# Patient Record
Sex: Male | Born: 1990 | Race: White | Hispanic: No | Marital: Single | Smoking: Current every day smoker
Health system: Southern US, Community
[De-identification: ages and names within clinical notes are randomized; demographics above are authoritative.]

## PROBLEM LIST (undated history)

## (undated) DIAGNOSIS — F419 Anxiety disorder, unspecified: Secondary | ICD-10-CM

## (undated) DIAGNOSIS — J189 Pneumonia, unspecified organism: Secondary | ICD-10-CM

## (undated) HISTORY — PX: EYE SURGERY: SHX253

## (undated) HISTORY — PX: OTHER SURGICAL HISTORY: SHX169

---

## 2015-08-07 DIAGNOSIS — J45909 Unspecified asthma, uncomplicated: Secondary | ICD-10-CM | POA: Insufficient documentation

## 2015-12-09 DIAGNOSIS — H269 Unspecified cataract: Secondary | ICD-10-CM | POA: Insufficient documentation

## 2017-11-02 DIAGNOSIS — H26003 Unspecified infantile and juvenile cataract, bilateral: Secondary | ICD-10-CM | POA: Insufficient documentation

## 2017-11-02 DIAGNOSIS — G43709 Chronic migraine without aura, not intractable, without status migrainosus: Secondary | ICD-10-CM | POA: Insufficient documentation

## 2019-06-01 ENCOUNTER — Other Ambulatory Visit: Payer: Self-pay | Admitting: Urology

## 2019-06-02 ENCOUNTER — Other Ambulatory Visit (HOSPITAL_COMMUNITY)
Admission: RE | Admit: 2019-06-02 | Discharge: 2019-06-02 | Disposition: A | Discharge status: Home / Self Care | Payer: 59 | Source: Ambulatory Visit | Attending: Urology | Admitting: Urology

## 2019-06-02 DIAGNOSIS — Z20822 Contact with and (suspected) exposure to covid-19: Secondary | ICD-10-CM | POA: Insufficient documentation

## 2019-06-02 DIAGNOSIS — Z01812 Encounter for preprocedural laboratory examination: Secondary | ICD-10-CM | POA: Diagnosis not present

## 2019-06-02 LAB — SARS CORONAVIRUS 2 (TAT 6-24 HRS): SARS Coronavirus 2: NEGATIVE

## 2019-06-04 ENCOUNTER — Encounter (HOSPITAL_COMMUNITY)
Admission: RE | Admit: 2019-06-04 | Discharge: 2019-06-04 | Disposition: A | Discharge status: Home / Self Care | Payer: 59 | Source: Ambulatory Visit | Attending: Urology | Admitting: Urology

## 2019-06-04 ENCOUNTER — Encounter (HOSPITAL_COMMUNITY): Payer: Self-pay

## 2019-06-04 ENCOUNTER — Other Ambulatory Visit: Payer: Self-pay

## 2019-06-04 HISTORY — DX: Pneumonia, unspecified organism: J18.9

## 2019-06-04 HISTORY — DX: Anxiety disorder, unspecified: F41.9

## 2019-06-04 NOTE — Progress Notes (Signed)
Called at 1050am with no answer.  Recalled at 1105am with no answer.

## 2019-06-06 ENCOUNTER — Ambulatory Visit (HOSPITAL_COMMUNITY): Payer: 59 | Admitting: Certified Registered Nurse Anesthetist

## 2019-06-06 ENCOUNTER — Encounter (HOSPITAL_COMMUNITY)
Admission: RE | Disposition: A | Discharge status: Home / Self Care | Payer: Self-pay | Source: Home / Self Care | Attending: Urology

## 2019-06-06 ENCOUNTER — Other Ambulatory Visit: Payer: Self-pay

## 2019-06-06 ENCOUNTER — Ambulatory Visit (HOSPITAL_COMMUNITY)
Admission: RE | Admit: 2019-06-06 | Discharge: 2019-06-06 | Disposition: A | Discharge status: Home / Self Care | Payer: 59 | Attending: Urology | Admitting: Urology

## 2019-06-06 ENCOUNTER — Encounter (HOSPITAL_COMMUNITY): Payer: Self-pay | Admitting: Urology

## 2019-06-06 DIAGNOSIS — N5089 Other specified disorders of the male genital organs: Secondary | ICD-10-CM

## 2019-06-06 DIAGNOSIS — C6292 Malignant neoplasm of left testis, unspecified whether descended or undescended: Secondary | ICD-10-CM | POA: Insufficient documentation

## 2019-06-06 DIAGNOSIS — N509 Disorder of male genital organs, unspecified: Secondary | ICD-10-CM | POA: Insufficient documentation

## 2019-06-06 DIAGNOSIS — Z885 Allergy status to narcotic agent status: Secondary | ICD-10-CM | POA: Insufficient documentation

## 2019-06-06 DIAGNOSIS — F17200 Nicotine dependence, unspecified, uncomplicated: Secondary | ICD-10-CM | POA: Diagnosis not present

## 2019-06-06 HISTORY — PX: ORCHIECTOMY: SHX2116

## 2019-06-06 LAB — CBC
HCT: 47.1 % (ref 39.0–52.0)
Hemoglobin: 17 g/dL (ref 13.0–17.0)
MCH: 33.8 pg (ref 26.0–34.0)
MCHC: 36.1 g/dL — ABNORMAL HIGH (ref 30.0–36.0)
MCV: 93.6 fL (ref 80.0–100.0)
Platelets: 209 10*3/uL (ref 150–400)
RBC: 5.03 MIL/uL (ref 4.22–5.81)
RDW: 11.8 % (ref 11.5–15.5)
WBC: 7.8 10*3/uL (ref 4.0–10.5)
nRBC: 0 % (ref 0.0–0.2)

## 2019-06-06 SURGERY — ORCHIECTOMY
Anesthesia: General | Laterality: Left

## 2019-06-06 MED ORDER — DEXAMETHASONE SODIUM PHOSPHATE 10 MG/ML IJ SOLN
INTRAMUSCULAR | Status: AC
  Filled 2019-06-06: qty 1

## 2019-06-06 MED ORDER — KETOROLAC TROMETHAMINE 15 MG/ML IJ SOLN: 15.0000 mg | Freq: Once | INTRAMUSCULAR | Status: DC

## 2019-06-06 MED ORDER — ONDANSETRON HCL 4 MG/2ML IJ SOLN
INTRAMUSCULAR | Status: DC | PRN
  Administered 2019-06-06: 4 mg via INTRAVENOUS

## 2019-06-06 MED ORDER — KETOROLAC TROMETHAMINE 30 MG/ML IJ SOLN
30.0000 mg | Freq: Once | INTRAMUSCULAR | Status: AC | PRN
  Administered 2019-06-06: 30 mg via INTRAVENOUS

## 2019-06-06 MED ORDER — DEXAMETHASONE SODIUM PHOSPHATE 4 MG/ML IJ SOLN
INTRAMUSCULAR | Status: DC | PRN
  Administered 2019-06-06: 10 mg via INTRAVENOUS

## 2019-06-06 MED ORDER — LACTATED RINGERS IV SOLN: INTRAVENOUS | Status: DC

## 2019-06-06 MED ORDER — ONDANSETRON HCL 4 MG/2ML IJ SOLN
INTRAMUSCULAR | Status: AC
  Filled 2019-06-06: qty 2

## 2019-06-06 MED ORDER — TRAMADOL HCL 50 MG PO TABS: 50.0000 mg | ORAL_TABLET | Freq: Four times a day (QID) | ORAL | Status: DC | PRN

## 2019-06-06 MED ORDER — MIDAZOLAM HCL 2 MG/2ML IJ SOLN
INTRAMUSCULAR | Status: DC | PRN
  Administered 2019-06-06: 2 mg via INTRAVENOUS

## 2019-06-06 MED ORDER — MIDAZOLAM HCL 2 MG/2ML IJ SOLN
INTRAMUSCULAR | Status: AC
  Filled 2019-06-06: qty 2

## 2019-06-06 MED ORDER — KETOROLAC TROMETHAMINE 30 MG/ML IJ SOLN
INTRAMUSCULAR | Status: AC
  Filled 2019-06-06: qty 1

## 2019-06-06 MED ORDER — BUPIVACAINE HCL (PF) 0.25 % IJ SOLN
INTRAMUSCULAR | Status: DC | PRN
  Administered 2019-06-06: 25 mL

## 2019-06-06 MED ORDER — FENTANYL CITRATE (PF) 100 MCG/2ML IJ SOLN
INTRAMUSCULAR | Status: AC
  Filled 2019-06-06: qty 2

## 2019-06-06 MED ORDER — GLYCOPYRROLATE PF 0.2 MG/ML IJ SOSY
PREFILLED_SYRINGE | INTRAMUSCULAR | Status: AC
  Filled 2019-06-06: qty 1

## 2019-06-06 MED ORDER — LIDOCAINE 2% (20 MG/ML) 5 ML SYRINGE
INTRAMUSCULAR | Status: AC
  Filled 2019-06-06: qty 5

## 2019-06-06 MED ORDER — LIDOCAINE 2% (20 MG/ML) 5 ML SYRINGE
INTRAMUSCULAR | Status: DC | PRN
  Administered 2019-06-06: 60 mg via INTRAVENOUS

## 2019-06-06 MED ORDER — TRAMADOL HCL 50 MG PO TABS: 50.0000 mg | ORAL_TABLET | Freq: Four times a day (QID) | ORAL | 0 refills | Status: AC | PRN

## 2019-06-06 MED ORDER — FENTANYL CITRATE (PF) 100 MCG/2ML IJ SOLN: 25.0000 ug | INTRAMUSCULAR | Status: DC | PRN

## 2019-06-06 MED ORDER — FENTANYL CITRATE (PF) 100 MCG/2ML IJ SOLN
INTRAMUSCULAR | Status: DC | PRN
  Administered 2019-06-06 (×5): 50 ug via INTRAVENOUS

## 2019-06-06 MED ORDER — ACETAMINOPHEN 500 MG PO TABS
1000.0000 mg | ORAL_TABLET | Freq: Once | ORAL | Status: AC
  Administered 2019-06-06: 1000 mg via ORAL
  Filled 2019-06-06: qty 2

## 2019-06-06 MED ORDER — CEFAZOLIN SODIUM-DEXTROSE 2-4 GM/100ML-% IV SOLN
2.0000 g | INTRAVENOUS | Status: AC
  Administered 2019-06-06: 2 g via INTRAVENOUS
  Filled 2019-06-06: qty 100

## 2019-06-06 MED ORDER — PROPOFOL 10 MG/ML IV BOLUS
INTRAVENOUS | Status: DC | PRN
  Administered 2019-06-06: 200 mg via INTRAVENOUS

## 2019-06-06 MED ORDER — FENTANYL CITRATE (PF) 250 MCG/5ML IJ SOLN
INTRAMUSCULAR | Status: AC
  Filled 2019-06-06: qty 5

## 2019-06-06 MED ORDER — 0.9 % SODIUM CHLORIDE (POUR BTL) OPTIME
TOPICAL | Status: DC | PRN
  Administered 2019-06-06: 1000 mL

## 2019-06-06 MED ORDER — BUPIVACAINE HCL 0.25 % IJ SOLN
INTRAMUSCULAR | Status: AC
  Filled 2019-06-06: qty 1

## 2019-06-06 MED ORDER — PROMETHAZINE HCL 25 MG/ML IJ SOLN: 6.2500 mg | INTRAMUSCULAR | Status: DC | PRN

## 2019-06-06 MED ORDER — PHENYLEPHRINE 40 MCG/ML (10ML) SYRINGE FOR IV PUSH (FOR BLOOD PRESSURE SUPPORT)
PREFILLED_SYRINGE | INTRAVENOUS | Status: AC
  Filled 2019-06-06: qty 10

## 2019-06-06 SURGICAL SUPPLY — 37 items
BENZOIN TINCTURE PRP APPL 2/3 (GAUZE/BANDAGES/DRESSINGS) IMPLANT
BNDG GAUZE ELAST 4 BULKY (GAUZE/BANDAGES/DRESSINGS) IMPLANT
CLOSURE WOUND 1/2 X4 (GAUZE/BANDAGES/DRESSINGS)
COVER SURGICAL LIGHT HANDLE (MISCELLANEOUS) ×3 IMPLANT
COVER WAND RF STERILE (DRAPES) IMPLANT
DERMABOND ADVANCED (GAUZE/BANDAGES/DRESSINGS) ×2
DERMABOND ADVANCED .7 DNX12 (GAUZE/BANDAGES/DRESSINGS) ×1 IMPLANT
DISSECTOR ROUND CHERRY 3/8 STR (MISCELLANEOUS) IMPLANT
DRAIN PENROSE 0.25X18 (DRAIN) ×3 IMPLANT
DRAPE LAPAROTOMY T 98X78 PEDS (DRAPES) ×3 IMPLANT
ELECT REM PT RETURN 15FT ADLT (MISCELLANEOUS) ×3 IMPLANT
GAUZE SPONGE 4X4 12PLY STRL (GAUZE/BANDAGES/DRESSINGS) ×3 IMPLANT
GLOVE BIOGEL M STRL SZ7.5 (GLOVE) ×3 IMPLANT
GOWN STRL REUS W/TWL XL LVL3 (GOWN DISPOSABLE) ×3 IMPLANT
KIT BASIN (CUSTOM PROCEDURE TRAY) ×3 IMPLANT
KIT TURNOVER KIT A (KITS) IMPLANT
NEEDLE HYPO 22GX1.5 SAFETY (NEEDLE) ×3 IMPLANT
NS IRRIG 1000ML POUR BTL (IV SOLUTION) ×3 IMPLANT
PACK GENERAL/GYN (CUSTOM PROCEDURE TRAY) ×3 IMPLANT
PENCIL SMOKE EVACUATOR (MISCELLANEOUS) IMPLANT
STRIP CLOSURE SKIN 1/2X4 (GAUZE/BANDAGES/DRESSINGS) IMPLANT
SUPPORT SCROTAL LG STRP (MISCELLANEOUS) IMPLANT
SUPPORT SCROTAL MED ADLT STRP (MISCELLANEOUS) ×2 IMPLANT
SUPPORTER ATHLETIC LG (MISCELLANEOUS)
SUPPORTER ATHLETIC MED (MISCELLANEOUS) ×1
SUT MNCRL AB 4-0 PS2 18 (SUTURE) ×3 IMPLANT
SUT SILK 0 (SUTURE)
SUT SILK 0 30XBRD TIE 6 (SUTURE) IMPLANT
SUT SILK 2 0 (SUTURE) ×2
SUT SILK 2 0 SH CR/8 (SUTURE) ×3 IMPLANT
SUT SILK 2-0 18XBRD TIE 12 (SUTURE) ×1 IMPLANT
SUT VIC AB 2-0 SH 27 (SUTURE)
SUT VIC AB 2-0 SH 27X BRD (SUTURE) IMPLANT
SUT VIC AB 3-0 SH 27 (SUTURE) ×4
SUT VIC AB 3-0 SH 27XBRD (SUTURE) ×2 IMPLANT
SYR CONTROL 10ML LL (SYRINGE) ×3 IMPLANT
TOWEL OR 17X26 10 PK STRL BLUE (TOWEL DISPOSABLE) ×3 IMPLANT

## 2019-06-06 NOTE — Interval H&P Note (Signed)
History and Physical Interval Note:  06/06/2019 9:50 AM  Jeffery Barker  has presented today for surgery, with the diagnosis of left testicular mass.  The various methods of treatment have been discussed with the patient and family. After consideration of risks, benefits and other options for treatment, the patient has consented to  Procedure(s): LEFT RADICAL ORCHIECTOMY (Left) as a surgical intervention.  The patient's history has been reviewed, patient examined, no change in status, stable for surgery.  I have reviewed the patient's chart and labs.  Questions were answered to the patient's satisfaction.     Ardis Hughs

## 2019-06-06 NOTE — Transfer of Care (Signed)
Immediate Anesthesia Transfer of Care Note  Patient: Jeffery Barker  Procedure(s) Performed: LEFT RADICAL ORCHIECTOMY (Left )  Patient Location: PACU  Anesthesia Type:General  Level of Consciousness: awake and patient cooperative  Airway & Oxygen Therapy: Patient Spontanous Breathing and Patient connected to face mask  Post-op Assessment: Report given to RN and Post -op Vital signs reviewed and stable  Post vital signs: Reviewed and stable  Last Vitals:  Vitals Value Taken Time  BP 153/99 06/06/19 1143  Temp    Pulse 80 06/06/19 1144  Resp 20 06/06/19 1144  SpO2 100 % 06/06/19 1144  Vitals shown include unvalidated device data.  Last Pain:  Vitals:   06/06/19 0927  TempSrc:   PainSc: 0-No pain         Complications: No apparent anesthesia complications

## 2019-06-06 NOTE — Anesthesia Preprocedure Evaluation (Signed)
Anesthesia Evaluation  Patient identified by MRN, date of birth, ID band Patient awake    Reviewed: Allergy & Precautions, NPO status , Patient's Chart, lab work & pertinent test results  Airway Mallampati: II  TM Distance: >3 FB Neck ROM: Full    Dental  (+) Dental Advisory Given   Pulmonary Current Smoker,    breath sounds clear to auscultation       Cardiovascular negative cardio ROS   Rhythm:Regular Rate:Normal     Neuro/Psych negative neurological ROS     GI/Hepatic negative GI ROS, Neg liver ROS,   Endo/Other  negative endocrine ROS  Renal/GU negative Renal ROS     Musculoskeletal   Abdominal   Peds  Hematology negative hematology ROS (+)   Anesthesia Other Findings   Reproductive/Obstetrics                             Anesthesia Physical Anesthesia Plan  ASA: II  Anesthesia Plan: General   Post-op Pain Management:    Induction: Intravenous  PONV Risk Score and Plan: 1 and Dexamethasone, Ondansetron and Treatment may vary due to age or medical condition  Airway Management Planned: LMA  Additional Equipment: None  Intra-op Plan:   Post-operative Plan: Extubation in OR  Informed Consent: I have reviewed the patients History and Physical, chart, labs and discussed the procedure including the risks, benefits and alternatives for the proposed anesthesia with the patient or authorized representative who has indicated his/her understanding and acceptance.     Dental advisory given  Plan Discussed with: CRNA  Anesthesia Plan Comments:         Anesthesia Quick Evaluation

## 2019-06-06 NOTE — Discharge Instructions (Signed)
Orchiectomy: POST OP INSTRUCTIONS  1. DIET: Follow a light bland diet the first 24 hours after arrival home, such as soup, liquids, crackers, etc.  Be sure to include lots of fluids daily.  Avoid fast food or heavy meals as your are more likely to get nauseated.  Eat a low fat the next few days after surgery. 2. Take your usually prescribed home medications unless otherwise directed. 3. PAIN CONTROL: a. Pain is best controlled by a usual combination of three different methods TOGETHER: i. Ice/Heat ii. Over the counter pain medication iii. Prescription pain medication b. Most patients will experience some swelling and bruising around the incision.  Ice packs or heating pads (30-60 minutes up to 6 times a day) will help. Use ice for the first few days to help decrease swelling and bruising, then switch to heat to help relax tight/sore spots and speed recovery.  Some people prefer to use ice alone, heat alone, alternating between ice & heat.  Experiment to what works for you.  Swelling and bruising can take several weeks to resolve.   c. It is helpful to take an over-the-counter pain medication regularly for the first few weeks.  Choose one of the following that works best for you: i. Naproxen (Aleve, etc)  Two 220mg  tabs twice a day ii. Ibuprofen (Advil, etc) Three 200mg  tabs four times a day (every meal & bedtime) iii. Acetaminophen (Tylenol, etc) 325-650mg  four times a day (every meal & bedtime) d. A  prescription for pain medication should be given to you upon discharge.  Take your pain medication as prescribed.  i. If you are having problems/concerns with the prescription medicine (does not control pain, nausea, vomiting, rash, itching, etc), please call us [phone] to see if we need to switch you to a different pain medicine that will work better for you and/or control your side effect better. 4. If you need a refill on your pain medication, please contactus. 5. Avoid getting constipated.   Between the surgery and the pain medications, it is common to experience some constipation.  Increasing fluid intake and taking a fiber supplement (such as Metamucil, Citrucel, FiberCon, MiraLax, etc) 1-2 times a day regularly will usually help prevent this problem from occurring.  A mild laxative (prune juice, Milk of Magnesia, MiraLax, etc) should be taken according to package directions if there are no bowel movements after 48 hours.   6. Wash / shower every day.  You may shower over the dressings as they are waterproof.   7. Remove your waterproof bandages 5 days after surgery.  You may leave the incision open to air.  You may replace a dressing/Band-Aid to cover the incision for comfort if you wish.  Continue to shower over incision(s) after the dressing is off. 8. ACTIVITIES as tolerated:   a. You may resume regular (light) daily activities beginning the next day--such as daily self-care, walking, climbing stairs--gradually increasing activities as tolerated.  If you can walk 30 minutes without difficulty, it is safe to try more intense activity such as jogging, treadmill, bicycling, low-impact aerobics, swimming, etc. b. Save the most intensive and strenuous activity for last such as sit-ups, heavy lifting, contact sports, etc  Refrain from any heavy lifting or straining until you are off narcotics for pain control.   c. DO NOT PUSH THROUGH PAIN.  Let pain be your guide: If it hurts to do something, don't do it.  Pain is your body warning you to avoid that activity for another  week until the pain goes down. d. You may drive when you are no longer taking prescription pain medication, you can comfortably wear a seatbelt, and you can safely maneuver your car and apply brakes. e. Jeffery Barker may have sexual intercourse when it is comfortable.  9. FOLLOW UP in our office a. Please call [facility] at [phone] to set up an appointment to see your surgeon in the office for a follow-up appointment  approximately 2-3 weeks after your surgery, if you have not been already scheduled for follow-up. b. Make sure that you call for this appointment the day you arrive home to insure a convenient appointment time. 9.  IF YOU HAVE DISABILITY OR FAMILY LEAVE FORMS, BRING THEM TO THE OFFICE FOR PROCESSING.  DO NOT GIVE THEM TO YOUR DOCTOR.  WHEN TO CALL us [phone]: 1. Poor pain control 2. Reactions / problems with new medications (rash/itching, nausea, etc)  3. Fever over 101.5 F (38.5 C) 4. Inability to urinate 5. Nausea and/or vomiting 6. Worsening swelling or bruising 7. Continued bleeding from incision. 8. Increased pain, redness, or drainage from the incision   The clinic staff is available to answer your questions during regular business hours (8:30am-5pm).  Please don't hesitate to call and ask to speak to one of our nurses for clinical concerns.   If you have a medical emergency, go to the nearest emergency room or call 911.

## 2019-06-06 NOTE — H&P (Signed)
I have swelling in my scrotum.  HPI: Jeffery Barker is a 29 year-old male patient who is here for scrotal swelling.  The mass is on the left side. The mass has grown in size.   29 year old male from [facility] self-referred for probable testicular cancer. The patient has had an enlarging left testicle for over a year. He has been quite anxious about seeking medical attention from this. 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 He mainly thought that his right testicle was drinking his left was not getting larger. He finally presented to the emergency room in [place] earlier this week where ultrasound showed a left testicular mass. He was told that he probably had testicular cancer. He comes here for further evaluation.   He denies significant weight loss, hemoptysis, abdominal pain. He is quite anxious.     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   Tobacco Use Assessment Completed: Used Tobacco in last 30 days? Does not drink anymore.  Patient uses recreational drugs. Uses marijuana. Drinks 3 caffeinated drinks per day. Has not had a blood transfusion.     Notes: Vapes at times   REVIEW OF SYSTEMS:    GU Review Male:   Patient denies frequent urination, hard to postpone urination, burning/ pain with urination, get up at night to urinate, leakage of urine, stream starts and stops, trouble starting your stream, have to strain to urinate ,  erection problems, and penile pain.  Gastrointestinal (Upper):   Patient reports nausea. Patient denies vomiting and indigestion/ heartburn.  Gastrointestinal (Lower):   Patient denies diarrhea and constipation.  Constitutional:   Patient reports night sweats, weight loss, and fatigue. Patient denies fever.  Skin:   Patient denies skin rash/ lesion and itching.  Eyes:   Patient denies blurred vision and double vision.  Ears/ Nose/ Throat:   Patient denies sore throat and sinus problems.  Hematologic/Lymphatic:   Patient denies swollen glands and easy bruising.  Cardiovascular:   Patient denies leg swelling and chest pains.  Respiratory:   Patient denies cough and shortness of breath.  Endocrine:   Patient denies excessive thirst.  Musculoskeletal:   Patient reports back pain and joint pain.   Neurological:   Patient reports dizziness. Patient denies headaches.  Psychologic:   Patient reports depression and anxiety.    VITAL SIGNS:      06/01/2019 10:58 AM  Weight 169 lb / 76.66 kg  Height 67 in / 170.18 cm  BP 125/85 mmHg  Heart Rate 73 /min  Temperature 98.0 F / 36.6 C  BMI 26.5 kg/m   GU PHYSICAL EXAMINATION:    Scrotum: No lesions. No edema. No cysts. No warts.  Epididymides: Right: no spermatocele, no masses, no cysts, no tenderness, no induration, no enlargement. Left: no spermatocele, no masses, no cysts, no tenderness, no induration, no enlargement.  Testes: Solid mass left testis. No tenderness,  no swelling, no enlargement left testis. No tenderness, no swelling, no enlargement right testis. Normal location left testis. Normal location right testis. No cyst, no varicocele, no hydrocele left testis. No mass, no cyst, no varicocele, no hydrocele right testis.   Urethral Meatus: Normal size. No lesion, no wart, no discharge, no polyp. Normal location.  Penis: Circumcised, no warts, no cracks. No dorsal Peyronie's plaques, no left corporal Peyronie's plaques, no right corporal  Peyronie's plaques, no scarring, no warts. No balanitis, no meatal stenosis.   MULTI-SYSTEM PHYSICAL EXAMINATION:    Constitutional: Well-nourished. No physical deformities. Normally developed. Good grooming.  Neck: Neck symmetrical, not swollen. Normal tracheal position.  Respiratory: No labored breathing, no use of accessory muscles.   Cardiovascular: Normal temperature, normal extremity pulses, no swelling, no varicosities.  Lymphatic: No enlargement of neck, axillae, groin.  Skin: No paleness, no jaundice, no cyanosis. No lesion, no ulcer, no rash.  Neurologic / Psychiatric: Oriented to time, oriented to place, oriented to person. No depression, no anxiety, no agitation.  Gastrointestinal: No mass, no tenderness, no rigidity, non obese abdomen.  Eyes: Normal conjunctivae. Normal eyelids.  Ears, Nose, Mouth, and Throat: Left ear no scars, no lesions, no masses. Right ear no scars, no lesions, no masses. Nose no scars, no lesions, no masses. Normal hearing. Normal lips.  Musculoskeletal: Normal gait and station of head and neck.     Complexity of Data:  Source Of History:  Patient  Records Review:   Previous Doctor Records  Urine Test Review:   Urinalysis  X-Ray Review: Scrotal Ultrasound: Reviewed Films. Reviewed Report. Discussed With Patient.     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   PLAN:            Medications New Meds: Ativan 0.5 mg tablet 1 tablet PO Q.8 hours p.r.n. anxiety   #20  0 Refill(s)            Orders Labs Alpha Fetoprotein (AFP), Beta HCG, LDH          Document Letter(s):  Created for Patient: Clinical Summary         Notes:   I discussed with the patient and his mother the fact that he more than likely has testicular cancer   I will draw serum tumor markers today   We will schedule him to have left radical  orchiectomy in the near future. I will be able to do this in the next week or so, so I will ask 1 of my partners   Following the procedure, if positive pathology, we will proceed with CT.

## 2019-06-06 NOTE — Anesthesia Postprocedure Evaluation (Signed)
Anesthesia Post Note  Patient: Journalist, [employer]  Procedure(s) Performed: LEFT RADICAL ORCHIECTOMY (Left )     Patient location during evaluation: PACU Anesthesia Type: General Level of consciousness: awake and alert Pain management: pain level controlled Vital Signs Assessment: post-procedure vital signs reviewed and stable Respiratory status: spontaneous breathing, nonlabored ventilation, respiratory function stable and patient connected to nasal cannula oxygen Cardiovascular status: blood pressure returned to baseline and stable Postop Assessment: no apparent nausea or vomiting Anesthetic complications: no    Last Vitals:  Vitals:   06/06/19 1200 06/06/19 1221  BP: (!) 158/94 (!) (P) 142/88  Pulse: 71 (P) 84  Resp: 16 (P) 18  Temp: 36.7 C (P) 36.7 C  SpO2: 100% (P) 94%    Last Pain:  Vitals:   06/06/19 1200  TempSrc:   PainSc: Tyler Deis

## 2019-06-06 NOTE — Op Note (Signed)
Preoperative diagnosis:  1. left testicular mass   Postoperative diagnosis:  1. same   Procedure: 1. left radical orchiectomy  Surgeon: Ardis Hughs, MD Assistant: none  Anesthesia: General  Complications: None  Intraoperative findings: firm, enlarged, irregular left testicle  EBL: Minimal  Specimens:  Left testicle and spermatic cord.  Indication:  Jeffery Barker is a 29 y.o.   patient with palpably abnormal left testicle.  Ultrasound of that testicle suggested a tumor concerning for malignancy.  After reviewing the management options for treatment, he elected to proceed with the above surgical procedure(s). We have discussed the potential benefits and risks of the procedure, side effects of the proposed treatment, the likelihood of the patient achieving the goals of the procedure, and any potential problems that might occur during the procedure or recuperation. Informed consent has been obtained.  Description of procedure:  The patient was taken to the operating room and general anesthesia was induced.  The patient was placed in the supine position, prepped and draped in the usual sterile fashion, and preoperative antibiotics were administered. A preoperative time-out was performed.   A 3 cm incision was made over the left inguinal region dissected down to the external oblique fascia. This was then opened and the ilioinguinal nerve spared. The spermatic cord was then encircled with blunt dissection a Penrose drain was passed around the spermatic cord and used as a tourniquet. Dissection of the spermatic cord distally down into the scrotum then ensued using combination of blunt dissection and electrocautery. The testicle was then passed up through the scrotum and into the field. The gubernaculum was then taken with electrocautery. Once the testicle was free from the scrotum I dissected more proximally to the internal inguinal ring. This point I suture ligated the artery  and the vas deferens separately using silk suture. The specimen was then removed and sent to pathology as left testicle and spermatic cord.  Hemostasis was then meticulously achieved. The external oblique aponeurosis was then reapproximated with 3-0 Vicryl in a running fashion. The wound was then irrigated and hemostasis again ensured. Scarpa's fascia was then closed with a 3-0 Vicryl. The skin was closed with a 4-0 Monocryl.  The patient tolerated the procedure well. At the end of the case all laps and needles and sponges were accounted for. The patient was transferred to the PACU in stable condition.

## 2019-06-06 NOTE — Anesthesia Procedure Notes (Signed)
Procedure Name: Intubation Date/Time: 06/06/2019 10:21 AM Performed by: Claudia Desanctis, CRNA Pre-anesthesia Checklist: Patient identified, Emergency Drugs available, Suction available and Patient being monitored Patient Re-evaluated:Patient Re-evaluated prior to induction Oxygen Delivery Method: Circle system utilized Preoxygenation: Pre-oxygenation with 100% oxygen Induction Type: IV induction Ventilation: Mask ventilation without difficulty LMA: LMA inserted LMA Size: 4.0 Number of attempts: 1 Airway Equipment and Method: Stylet Placement Confirmation: ETT inserted through vocal cords under direct vision,  positive ETCO2 and breath sounds checked- equal and bilateral Tube secured with: Tape Dental Injury: Teeth and Oropharynx as per pre-operative assessment

## 2019-06-08 LAB — SURGICAL PATHOLOGY

## 2019-08-07 ENCOUNTER — Ambulatory Visit: Payer: Self-pay

## 2019-08-07 ENCOUNTER — Other Ambulatory Visit: Payer: Self-pay

## 2019-08-07 ENCOUNTER — Other Ambulatory Visit: Payer: Self-pay | Admitting: Family Medicine

## 2019-08-07 DIAGNOSIS — Z0000 Encounter for general adult medical examination without abnormal findings: Secondary | ICD-10-CM

## 2020-05-29 ENCOUNTER — Other Ambulatory Visit (HOSPITAL_COMMUNITY): Payer: Self-pay | Admitting: Urology

## 2020-05-29 ENCOUNTER — Ambulatory Visit (HOSPITAL_COMMUNITY)
Admission: RE | Admit: 2020-05-29 | Discharge: 2020-05-29 | Disposition: A | Discharge status: Home / Self Care | Payer: 59 | Source: Ambulatory Visit | Attending: Urology | Admitting: Urology

## 2020-05-29 ENCOUNTER — Other Ambulatory Visit: Payer: Self-pay

## 2020-05-29 DIAGNOSIS — Z8547 Personal history of malignant neoplasm of testis: Secondary | ICD-10-CM | POA: Insufficient documentation

## 2021-07-16 ENCOUNTER — Telehealth: Payer: Self-pay | Admitting: Oncology

## 2021-07-16 NOTE — Telephone Encounter (Signed)
Scheduled appt per 6/21 referral. Pt is aware of appt date and time. Pt is aware to arrive 15 mins prior to appt time and to bring and updated insurance card. Pt is aware of appt location.   

## 2021-08-06 ENCOUNTER — Other Ambulatory Visit: Payer: Self-pay

## 2021-08-06 ENCOUNTER — Inpatient Hospital Stay: Payer: 59 | Attending: Oncology | Admitting: Oncology

## 2021-08-06 VITALS — BP 130/77 | HR 82 | Temp 98.2°F | Resp 18 | Ht 67.0 in | Wt 169.5 lb

## 2021-08-06 DIAGNOSIS — G8929 Other chronic pain: Secondary | ICD-10-CM

## 2021-08-06 DIAGNOSIS — M5489 Other dorsalgia: Secondary | ICD-10-CM | POA: Diagnosis not present

## 2021-08-06 DIAGNOSIS — F17210 Nicotine dependence, cigarettes, uncomplicated: Secondary | ICD-10-CM

## 2021-08-06 DIAGNOSIS — C6292 Malignant neoplasm of left testis, unspecified whether descended or undescended: Secondary | ICD-10-CM

## 2021-08-06 DIAGNOSIS — C772 Secondary and unspecified malignant neoplasm of intra-abdominal lymph nodes: Secondary | ICD-10-CM | POA: Diagnosis not present

## 2021-08-06 DIAGNOSIS — C6290 Malignant neoplasm of unspecified testis, unspecified whether descended or undescended: Secondary | ICD-10-CM | POA: Insufficient documentation

## 2021-08-06 NOTE — Progress Notes (Signed)
Reason for the request: Testicular cancer  HPI: I was asked by Dr. Louis Barker to evaluate Jeffery Barker for the evaluation of testicular cancer.  He is a 31 year old with history of testicular cancer that was diagnosed in 2021.  He presented with scrotal swelling at that time and found to have a left allergic testicular mass.  He underwent left radical orchiectomy completed by Dr. Louis Barker on Jun 06, 2019.  He final pathology showed seminoma measuring 5.9 cm with tumor limited to the testes with lymphovascular invasion and syncytiotrophoblastic cells.  The final pathological staging was T2N0.  He remained on active surveillance with imaging studies in 2022 showed no evidence of metastatic disease.  CT scan of the abdomen and pelvis obtained on July 12, 2021 showed a new retroperitoneal lymph node in the left periaortic region measuring 1.9 x 1.5 cm.  No other pathologically enlarged lymph nodes were involved.  CT scan of the chest showed no evidence of metastatic disease.  Tumor markers were obtained on July 18, 2021 showed a beta-hCG of less than 1 and alpha-fetoprotein.  Clinically, he reports feeling well without any major complaints.  He does have chronic radicular back pain for last 2 years.  He denies any fevers or chills or sweats.  He denies any changes in his bowels.   He does not report any headaches, blurry vision, syncope or seizures. Does not report any fevers, chills or sweats.  Does not report any cough, wheezing or hemoptysis.  Does not report any chest pain, palpitation, orthopnea or leg edema.  Does not report any nausea, vomiting or abdominal pain.  Does not report any constipation or diarrhea.  Does not report any skeletal complaints.    Does not report frequency, urgency or hematuria.  Does not report any skin rashes or lesions. Does not report any heat or cold intolerance.  Does not report any lymphadenopathy or petechiae.  Does not report any anxiety or depression.  Remaining review of systems  is negative.     Past Medical History:  Diagnosis Date   Anxiety    Pneumonia    hx of as a chld   :   Past Surgical History:  Procedure Laterality Date   EYE SURGERY     left eye cataract surgery    ORCHIECTOMY Left 06/06/2019   Procedure: LEFT RADICAL ORCHIECTOMY;  Surgeon: Jeffery Hughs, MD;  Location: [facility];  Service: Urology;  Laterality: Left;   rod placed behind sternum then removed      undescended testicle surgeyr     :   Current Outpatient Medications:    LORazepam (ATIVAN) 0.5 MG tablet, Take 0.5 mg by mouth every 8 (eight) hours., Disp: , Rfl:    traMADol (ULTRAM) 50 MG tablet, Take 1-2 tablets (50-100 mg total) by mouth every 6 (six) hours as needed for moderate pain., Disp: 15 tablet, Rfl: 0:  No Known Allergies:  No family history on file.:   Social History   Socioeconomic History   Marital status: Single    Spouse name: Not on file   Number of children: Not on file   Years of education: Not on file   Highest education level: Not on file  Occupational History   Not on file  Tobacco Use   Smoking status: Every Day    Types: Cigarettes   Smokeless tobacco: Never  Vaping Use   Vaping Use: Former  Substance and Sexual Activity   Alcohol use: Not Currently   Drug use: Not  Currently    Types: Marijuana   Sexual activity: Not on file  Other Topics Concern   Not on file  Social History Narrative   Not on file   Social Determinants of Health   Financial Resource Strain: Not on file  Food Insecurity: Not on file  Transportation Needs: Not on file  Physical Activity: Not on file  Stress: Not on file  Social Connections: Not on file  Intimate Partner Violence: Not on file  :  Pertinent items are noted in HPI.  Exam:  General appearance: alert and cooperative appeared without distress. Head: atraumatic without any abnormalities. Eyes: conjunctivae/corneas clear. PERRL.  Sclera anicteric. Throat: lips, mucosa, and tongue normal;  without oral thrush or ulcers. Resp: clear to auscultation bilaterally without rhonchi, wheezes or dullness to percussion. Cardio: regular rate and rhythm, S1, S2 normal, no murmur, click, rub or gallop GI: soft, non-tender; bowel sounds normal; no masses,  no organomegaly Skin: Skin color, texture, turgor normal. No rashes or lesions Lymph nodes: Cervical, supraclavicular, and axillary nodes normal. Neurologic: Grossly normal without any motor, sensory or deep tendon reflexes. Musculoskeletal: No joint deformity or effusion.   Assessment and Plan:   31 year old with:  1.  Left testicular seminoma diagnosed in 2021.  He was found to have T2N0 with lymphovascular invasion and no evidence of metastatic disease.  He remained on active surveillance and developed relapsed disease as evident by a CT scan and now June 2023.  He was found to have an isolated retroperitoneal lymph node measuring 1.5 x 1.9 cm.  He has no evidence of metastatic disease anywhere else.  His tumor markers are obtained in June and were within normal range.  The natural course of this disease was reviewed at this time and treatment choices were discussed.  Currently he has stage IIA disease which could lend itself to treatment with systemic chemotherapy utilizing 3 cycles of BEP or 4 cycles of EP versus radiation therapy.  Given the limited extent of metastatic disease and the likelihood of achieving excellent disease control and salvage with radiation I would prefer that he undergo radiation therapy and defer systemic chemotherapy at a later date if he has another relapse.  This is with the caveat that his tumor markers are within normal range which we will be obtaining from his recent labs.  After discussion today, he is in agreement and will make the appropriate referral to radiation oncology.  2.  Follow-up: We will be in 3 months to assess him after completing radiation therapy.  We will arrange for imaging studies in 6  months.   60  minutes were dedicated to this visit. The time was spent on reviewing pathology results, imaging studies, discussing treatment options,  and answering questions regarding future plan.   A copy of this consult has been forwarded to the requesting physician.

## 2021-08-10 ENCOUNTER — Telehealth: Payer: Self-pay

## 2021-08-10 NOTE — Telephone Encounter (Signed)
Spoke with Patient regarding FMLA paperwork received. Patient states that he has been out of work for more than 2 weeks. Patient's first visit with Oncologist was 08/06/21. Provider referred Patient to Radiation Oncologist at that time. Patient's consultation appointment for radiation therapy is 08/24/21. Oncologist is unable to provide FMLA paperwork for time prior to his first appointment. Advised Patient to consult with referring Provider for [facility] paperwork and continuous leave. This Nurse explained to Patient that Oncologist could only date paperwork according to his scheduled visits. Explained to Patient that once a treatment schedule is established FMLA paperwork can be completed by the Oncologist or Radiation Oncologist. Patient verbalized understanding stating "I know what I have to do."

## 2021-08-18 ENCOUNTER — Telehealth: Payer: Self-pay | Admitting: Oncology

## 2021-08-18 NOTE — Telephone Encounter (Signed)
Called patient regarding upcoming appointments, I was unable to leave a voicemail. Calender will be mailed.

## 2021-08-18 NOTE — Progress Notes (Signed)
GU Location of Tumor / Histology: Testicular Ca (Left orchiectomy)    Past/Anticipated interventions by urology, if any:   06/15/2019 Dr. Louis Meckel LEFT RADICAL ORCHIECTOMY (Left) as a surgical intervention.  Past/Anticipated interventions by medical oncology, if any:   08/06/2021 Dr. Alen Blew Assessment and Plan:  31 year old with:  1.  Left testicular seminoma diagnosed in 2021.  He was found to have T2N0 with lymphovascular invasion and no evidence of metastatic disease.  He remained on active surveillance and developed relapsed disease as evident by a CT scan and now June 2023.  He was found to have an isolated retroperitoneal lymph node measuring 1.5 x 1.9 cm.  He has no evidence of metastatic disease anywhere else.  His tumor markers are obtained in June and were within normal range.   The natural course of this disease was reviewed at this time and treatment choices were discussed.  Currently he has stage IIA disease which could lend itself to treatment with systemic chemotherapy utilizing 3 cycles of BEP or 4 cycles of EP versus radiation therapy.  Given the limited extent of metastatic disease and the likelihood of achieving excellent disease control and salvage with radiation I would prefer that he undergo radiation therapy and defer systemic chemotherapy at a later date if he has another relapse.  This is with the caveat that his tumor markers are within normal range which we will be obtaining from his recent labs.  After discussion today, he is in agreement and will make the appropriate referral to radiation oncology.   2.  Follow-up: We will be in 3 months to assess him after completing radiation therapy.  We will arrange for imaging studies in 6 months.   Weight changes, if any:  No  SHIM: No sexual activity at this time.  Bowel/Bladder complaints, if any:  No  Nausea/Vomiting, if any: No  Pain issues, if any:  0  SAFETY ISSUES: Prior radiation? No Pacemaker/ICD?  No Possible current pregnancy? Male Is the patient on methotrexate? No  Current Complaints / other details:

## 2021-08-21 DIAGNOSIS — C6292 Malignant neoplasm of left testis, unspecified whether descended or undescended: Secondary | ICD-10-CM | POA: Insufficient documentation

## 2021-08-21 NOTE — Progress Notes (Signed)
Radiation Oncology         [phone] ________________________________  Initial {Inpatient / Outpatient:20114} Consultation  Name: Jeffery Barker MRN: [id number]  Date of Service: 08/24/2021 DOB: 10-23-1990  LK:[id number], No Pcp Per  Wyatt Portela, MD   REFERRING PHYSICIAN: Wyatt Portela, MD  DIAGNOSIS: 31 y/o male with stage IIA, seminoma of the left testis with a new solitary left periaortic node status post left radical orchiectomy in 2021.    ICD-10-CM   1. Malignant neoplasm of descended left testis (HCC)  C62.12     2. Seminoma of left testis, stage 2 (HCC)  C62.92       HISTORY OF PRESENT ILLNESS: Jeffery Barker is a 31 y.o. male seen at the request of Dr. Alen Blew.  He initially presented to the emergency department at [facility] in May 2021 with concerns of an enlarging left testicle, progressive over the year prior.  Ultrasound was performed at that time and did show a left testicular mass, suspicious for testicular cancer.  He has a history of right orchiopexy for an undescended right testis at 31 years old.  He self-referred to Dr. Diona Fanti at [facility] that same week and elected to proceed with a left radical orchiectomy performed by Dr. Louis Meckel on 06/06/2019.  Final surgical pathology confirmed a 5.9 cm seminoma with lymphovascular invasion and synctiotrophoblastic cells, T2N0.  He has been closely monitored and active surveillance since that time without any evidence of recurrence on follow-up imaging in 2022.  However, more recently, on CT A/P 07/12/2021, he was noted to have a new left periaortic node measuring 1.9 x 1.5 cm but no other lymph nodes or evidence of metastatic disease.  He had a CT chest on 07/22/2021 showing no evidence of metastatic disease.  Tumor markers were within normal limits.  He met with Dr. Alen Blew on 08/06/2021 to discuss treatment options and his recommendation was to proceed with radiotherapy and defer systemic therapy for any future relapse  if necessary.  He has been kindly referred today to discuss the radiation treatment options.  PREVIOUS RADIATION THERAPY: No  PAST MEDICAL HISTORY:  Past Medical History:  Diagnosis Date   Anxiety    Pneumonia    hx of as a chld       PAST SURGICAL HISTORY: Past Surgical History:  Procedure Laterality Date   EYE SURGERY     left eye cataract surgery    ORCHIECTOMY Left 06/06/2019   Procedure: LEFT RADICAL ORCHIECTOMY;  Surgeon: Ardis Hughs, MD;  Location: [facility];  Service: Urology;  Laterality: Left;   rod placed behind sternum then removed      undescended testicle surgeyr       FAMILY HISTORY: No family history on file.  SOCIAL HISTORY:  Social History   Socioeconomic History   Marital status: Single    Spouse name: Not on file   Number of children: Not on file   Years of education: Not on file   Highest education level: Not on file  Occupational History   Not on file  Tobacco Use   Smoking status: Every Day    Types: Cigarettes   Smokeless tobacco: Never  Vaping Use   Vaping Use: Former  Substance and Sexual Activity   Alcohol use: Not Currently   Drug use: Not Currently    Types: Marijuana   Sexual activity: Not on file  Other Topics Concern   Not on file  Social History Narrative   Not on file  Social Determinants of Health   Financial Resource Strain: Not on file  Food Insecurity: Not on file  Transportation Needs: Not on file  Physical Activity: Not on file  Stress: Not on file  Social Connections: Not on file  Intimate Partner Violence: Not on file    ALLERGIES: Patient has no known allergies.  MEDICATIONS:  Current Outpatient Medications  Medication Sig Dispense Refill   LORazepam (ATIVAN) 0.5 MG tablet Take 0.5 mg by mouth every 8 (eight) hours.     traMADol (ULTRAM) 50 MG tablet Take 1-2 tablets (50-100 mg total) by mouth every 6 (six) hours as needed for moderate pain. 15 tablet 0   No current facility-administered  medications for this visit.    REVIEW OF SYSTEMS:  On review of systems, the patient reports that *** is doing well overall. *** denies any chest pain, shortness of breath, cough, fevers, chills, night sweats, unintended weight changes. *** denies any bowel or bladder disturbances, and denies abdominal pain, nausea or vomiting. *** denies any new musculoskeletal or joint aches or pains. A complete review of systems is obtained and is otherwise negative.    PHYSICAL EXAM:  Wt Readings from Last 3 Encounters:  08/06/21 169 lb 8 oz (76.9 kg)  06/04/19 169 lb (76.7 kg)   Temp Readings from Last 3 Encounters:  08/06/21 98.2 F (36.8 C) (Temporal)  06/06/19 (!) 97.5 F (36.4 C)   BP Readings from Last 3 Encounters:  08/06/21 130/77  06/06/19 122/82   Pulse Readings from Last 3 Encounters:  08/06/21 82  06/06/19 63    /10  In general this is a well appearing Caucasian male in no acute distress.  He's alert and oriented x4 and appropriate throughout the examination. Cardiopulmonary assessment is negative for acute distress and he exhibits normal effort.    KPS = ***  100 - Normal; no complaints; no evidence of disease. 90   - Able to carry on normal activity; minor signs or symptoms of disease. 80   - Normal activity with effort; some signs or symptoms of disease. 44   - Cares for self; unable to carry on normal activity or to do active work. 60   - Requires occasional assistance, but is able to care for most of his personal needs. 50   - Requires considerable assistance and frequent medical care. 17   - Disabled; requires special care and assistance. 59   - Severely disabled; hospital admission is indicated although death not imminent. 48   - Very sick; hospital admission necessary; active supportive treatment necessary. 10   - Moribund; fatal processes progressing rapidly. 0     - Dead  Karnofsky DA, Abelmann [place], Craver LS and Burchenal [facility] [phone]) The use of the nitrogen mustards  in the palliative treatment of carcinoma: with particular reference to bronchogenic carcinoma Cancer 1 634-56  LABORATORY DATA:  Lab Results  Component Value Date   WBC 7.8 06/06/2019   HGB 17.0 06/06/2019   HCT 47.1 06/06/2019   MCV 93.6 06/06/2019   PLT 209 06/06/2019   No results found for: "NA", "K", "CL", "CO2" No results found for: "ALT", "AST", "GGT", "ALKPHOS", "BILITOT"   RADIOGRAPHY: No results found.    IMPRESSION/PLAN: 1. 31 y.o. male with stage IIA, seminoma of the left testis with a new solitary left periaortic node status post left radical orchiectomy in 2021. Today, we talked to the patient and family about the findings and workup thus far. We discussed the natural history  of testicular seminoma and general treatment, highlighting the role of radiotherapy in the management. We discussed the available radiation techniques, and focused on the details and logistics of delivery. The recommendation is for ***  We reviewed the anticipated acute and late sequelae associated with radiation in this setting. The patient was encouraged to ask questions that were answered to his/her satisfaction.  I personally spent *** minutes in this encounter including chart review, reviewing radiological studies, meeting face-to-face with the patient, entering orders and completing documentation.  ------------------------------------------------   Tyler Pita, MD [place]: [phone]  Fax: [phone] [email or web address]  Skype  LinkedIn

## 2021-08-24 ENCOUNTER — Other Ambulatory Visit: Payer: Self-pay

## 2021-08-24 ENCOUNTER — Ambulatory Visit
Admission: RE | Admit: 2021-08-24 | Discharge: 2021-08-24 | Disposition: A | Discharge status: Home / Self Care | Payer: 59 | Source: Ambulatory Visit | Attending: Radiation Oncology | Admitting: Radiation Oncology

## 2021-08-24 VITALS — BP 145/98 | HR 76 | Temp 97.9°F | Resp 18 | Ht 67.0 in | Wt 180.4 lb

## 2021-08-24 DIAGNOSIS — C6212 Malignant neoplasm of descended left testis: Secondary | ICD-10-CM

## 2021-08-24 DIAGNOSIS — F17210 Nicotine dependence, cigarettes, uncomplicated: Secondary | ICD-10-CM | POA: Diagnosis not present

## 2021-08-24 DIAGNOSIS — C6292 Malignant neoplasm of left testis, unspecified whether descended or undescended: Secondary | ICD-10-CM

## 2021-09-23 ENCOUNTER — Telehealth: Payer: Self-pay | Admitting: *Deleted

## 2021-09-23 NOTE — Telephone Encounter (Signed)
Connected with [facility] customer representative Denton Ar [phone]) to inquire of status and need for [facility] provider statement for disability claim number: [id number].  Advised Jeffery Barker was seen 08/06/2021 by Dr. Alen Blew for F/U after completion of radiation.  Dr. Tammi Klippel saw Jeffery Barker 08/24/2021 and referred patient to [facility].  No future appointments with [facility] to receive treatment closer to home referred to Dr. Gwendlyn Deutscher, MD at [facility].  Denton Ar will inform case manager.  Provided direct extension awaiting return call.

## 2021-11-06 ENCOUNTER — Other Ambulatory Visit: Payer: Self-pay

## 2021-11-06 ENCOUNTER — Inpatient Hospital Stay: Payer: 59 | Attending: Oncology | Admitting: Oncology

## 2021-11-06 VITALS — BP 110/66 | HR 86 | Temp 97.8°F | Resp 16 | Ht 67.0 in | Wt 174.5 lb

## 2021-11-06 DIAGNOSIS — Z8547 Personal history of malignant neoplasm of testis: Secondary | ICD-10-CM | POA: Insufficient documentation

## 2021-11-06 DIAGNOSIS — Z9079 Acquired absence of other genital organ(s): Secondary | ICD-10-CM | POA: Insufficient documentation

## 2021-11-06 DIAGNOSIS — C6292 Malignant neoplasm of left testis, unspecified whether descended or undescended: Secondary | ICD-10-CM | POA: Diagnosis not present

## 2021-11-06 NOTE — Progress Notes (Signed)
Hematology and Oncology Follow Up Visit  Atwood Adcock [id number] [birth year] 31 y.o. 11/06/2021 1:26 PM Patient, No Pcp PerHerrick, Viona Gilmore, MD   Principle Diagnosis: 31 year old with left testicular seminoma diagnosed in 2021.  He initially presented with T2N0 and subsequently developed stage IIA disease in June 2023 with isolated left retroperitoneal lymph node measuring 1.5 x 1.9 cm.   Prior Therapy: Left radical orchiectomy completed on Jun 06, 2019. He final pathology showed seminoma measuring 5.9 cm with tumor limited to the testes with lymphovascular invasion and syncytiotrophoblastic cells.  The final pathological staging was T2N0.  He developed relapsed disease with a left retroperitoneal lymph node.  He is status post radiation therapy for salvage purposes completed in [place] completed in September 2023.  Current therapy: Active surveillance.  Interim History: Mr. Hillyard returns today for a follow-up visit.  Since the last visit, he completed radiation therapy without any major complications.  He did report some mild fatigue and diarrhea which has resolved at this time.  He denies any nausea, vomiting or abdominal pain.     Medications: I have reviewed the patient's current medications.  Current Outpatient Medications  Medication Sig Dispense Refill   busPIRone (BUSPAR) 7.5 MG tablet Take 7.5 mg by mouth 2 (two) times daily.     escitalopram (LEXAPRO) 5 MG tablet Take 5 mg by mouth daily.     LORazepam (ATIVAN) 0.5 MG tablet Take 0.5 mg by mouth every 8 (eight) hours.     meloxicam (MOBIC) 15 MG tablet Take 15 mg by mouth daily.     traMADol (ULTRAM) 50 MG tablet Take 1-2 tablets (50-100 mg total) by mouth every 6 (six) hours as needed for moderate pain. 15 tablet 0   Vitamin D, Ergocalciferol, (DRISDOL) 1.25 MG (50000 UNIT) CAPS capsule Take 50,000 Units by mouth once a week.     No current facility-administered medications for this visit.     Allergies: No  Known Allergies    Physical Exam: Blood pressure 110/66, pulse 86, temperature 97.8 F (36.6 C), temperature source Temporal, resp. rate 16, height '5\' 7"'$  (1.702 m), weight 174 lb 8 oz (79.2 kg), SpO2 98 %.  ECOG: 0   General appearance: Comfortable appearing without any discomfort Head: Normocephalic without any trauma Oropharynx: Mucous membranes are moist and pink without any thrush or ulcers. Eyes: Pupils are equal and round reactive to light. Lymph nodes: No cervical, supraclavicular, inguinal or axillary lymphadenopathy.   Heart:regular rate and rhythm.  S1 and S2 without leg edema. Lung: Clear without any rhonchi or wheezes.  No dullness to percussion. Abdomin: Soft, nontender, nondistended with good bowel sounds.  No hepatosplenomegaly. Musculoskeletal: No joint deformity or effusion.  Full range of motion noted. Neurological: No deficits noted on motor, sensory and deep tendon reflex exam. Skin: No petechial rash or dryness.  Appeared moist.      Lab Results: Lab Results  Component Value Date   WBC 7.8 06/06/2019   HGB 17.0 06/06/2019   HCT 47.1 06/06/2019   MCV 93.6 06/06/2019   PLT 209 06/06/2019     Chemistry   No results found for: "NA", "K", "CL", "CO2", "BUN", "CREATININE", "GLU" No results found for: "CALCIUM", "ALKPHOS", "AST", "ALT", "BILITOT"        Impression and Plan:   31 year old with:  1.  Stage IIa left testicular seminoma noted in June 2023.  He initially presented with T2N0 with lymphovascular invasion.   He is currently on active surveillance after completing radiation therapy.  The natural course of this disease and risk of relapse was discussed at this time.  Repeat CT scan will be required for surveillance purposes every 6 months for the first 2 years and annually after that.  Salvage therapy option including chemotherapy as well as retroperitoneal lymph node dissection were reviewed.  BEP chemotherapy remains the approved method at this  time in case of relapse.  He is scheduled to have repeat scan in December 2023 under the care of his radiation oncologist in [place].  I encouraged him to do so we will obtain these results and review personally at that time.   2.  Follow-up: Follow-up in 2 to 3 months to discuss the results of the scan.     30  minutes were gated to this encounter.  The time was spent on updating disease status, treatment choices and outlining future plan of care review.   Zola Button, MD 10/13/20231:26 PM

## 2022-01-28 ENCOUNTER — Inpatient Hospital Stay: Payer: 59 | Attending: Oncology | Admitting: Oncology

## 2022-01-28 NOTE — Progress Notes (Signed)
ERROR

## 2022-06-18 IMAGING — DX DG CHEST 2V
2 series · 2 of 2 positions shown · non-contrast
Comparison: 08/07/2019

CLINICAL DATA: Insert testicular cancer

EXAM:
CHEST - 2 VIEW

[chest pa]
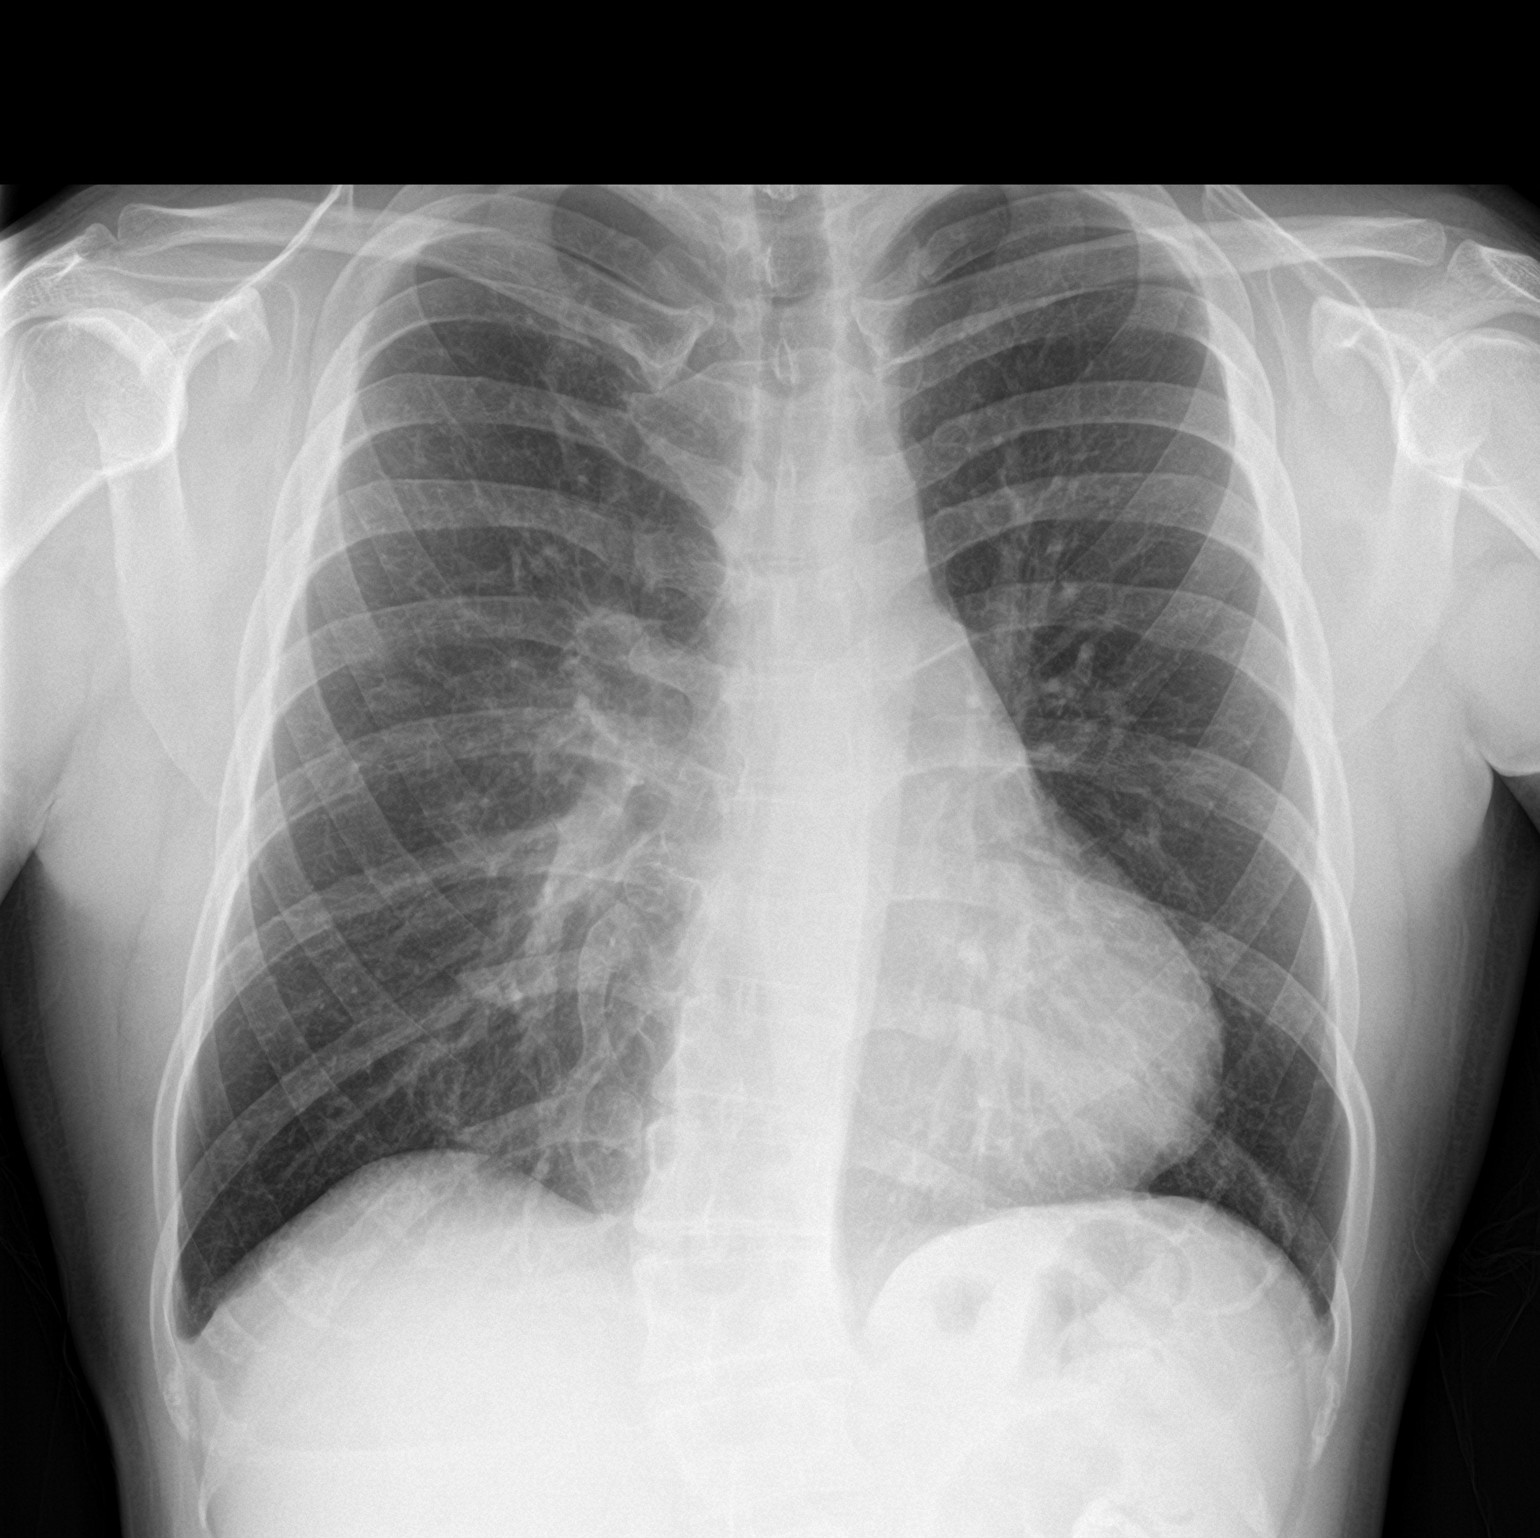

[chest lat]
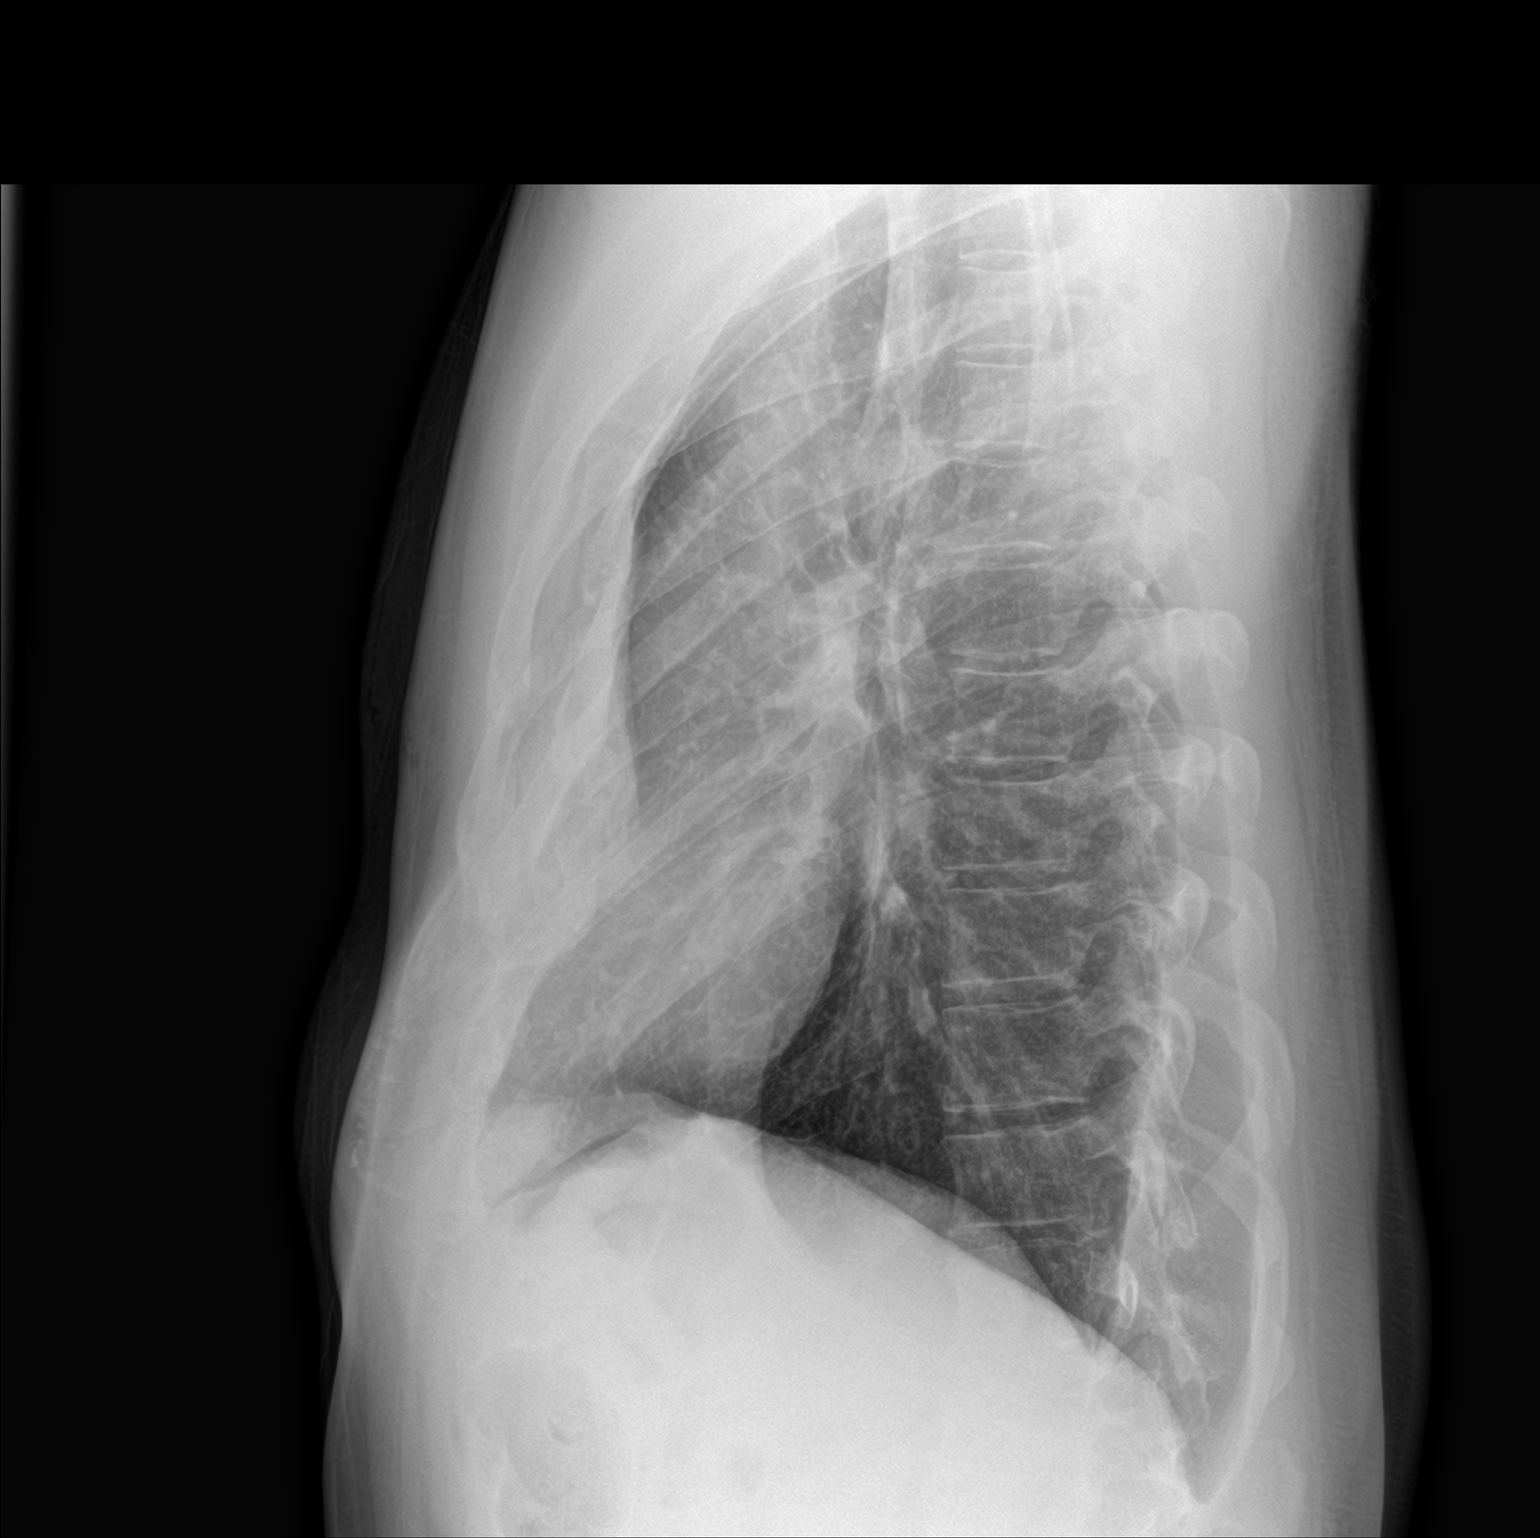

[2 of 2 positions shown; findings below may reference images not displayed]

FINDINGS: The heart size and mediastinal contours are within normal limits.
Both lungs are clear. The visualized skeletal structures are
unremarkable.
IMPRESSION: No active cardiopulmonary disease.

## 2023-03-28 ENCOUNTER — Encounter: Payer: Self-pay | Admitting: Urology

## 2023-03-28 ENCOUNTER — Other Ambulatory Visit: Payer: Self-pay | Admitting: Urology

## 2023-03-28 DIAGNOSIS — C6292 Malignant neoplasm of left testis, unspecified whether descended or undescended: Secondary | ICD-10-CM

## 2023-03-28 DIAGNOSIS — C6212 Malignant neoplasm of descended left testis: Secondary | ICD-10-CM
